# Patient Record
Sex: Male | Born: 2000 | Race: Black or African American | Hispanic: No | Marital: Single
Health system: Southern US, Community
[De-identification: ages and names within clinical notes are randomized; demographics above are authoritative.]

## PROBLEM LIST (undated history)

## (undated) DIAGNOSIS — J40 Bronchitis, not specified as acute or chronic: Secondary | ICD-10-CM

## (undated) DIAGNOSIS — F909 Attention-deficit hyperactivity disorder, unspecified type: Secondary | ICD-10-CM

## (undated) DIAGNOSIS — A549 Gonococcal infection, unspecified: Secondary | ICD-10-CM

## (undated) HISTORY — PX: OTHER SURGICAL HISTORY: SHX169

---
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
## 2018-01-19 ENCOUNTER — Encounter (HOSPITAL_COMMUNITY): Payer: Self-pay

## 2018-01-19 ENCOUNTER — Emergency Department (HOSPITAL_COMMUNITY)
Admission: EM | Admit: 2018-01-19 | Discharge: 2018-01-19 | Disposition: A | Discharge status: Home / Self Care | Payer: Medicaid Other | Attending: Emergency Medicine | Admitting: Emergency Medicine

## 2018-01-19 ENCOUNTER — Other Ambulatory Visit: Payer: Self-pay

## 2018-01-19 DIAGNOSIS — K029 Dental caries, unspecified: Secondary | ICD-10-CM | POA: Insufficient documentation

## 2018-01-19 DIAGNOSIS — K0889 Other specified disorders of teeth and supporting structures: Secondary | ICD-10-CM

## 2018-01-19 DIAGNOSIS — F1290 Cannabis use, unspecified, uncomplicated: Secondary | ICD-10-CM | POA: Insufficient documentation

## 2018-01-19 MED ORDER — IBUPROFEN 400 MG PO TABS
600.0000 mg | ORAL_TABLET | Freq: Once | ORAL | Status: AC
  Administered 2018-01-19: 600 mg via ORAL
  Filled 2018-01-19: qty 1

## 2018-01-19 MED ORDER — CLINDAMYCIN HCL 150 MG PO CAPS
300.0000 mg | ORAL_CAPSULE | Freq: Once | ORAL | Status: AC
  Administered 2018-01-19: 300 mg via ORAL
  Filled 2018-01-19: qty 2

## 2018-01-19 MED ORDER — CLINDAMYCIN HCL 300 MG PO CAPS: 300.0000 mg | ORAL_CAPSULE | Freq: Three times a day (TID) | ORAL | 0 refills | Status: DC

## 2018-01-19 MED ORDER — IBUPROFEN 600 MG PO TABS: 600.0000 mg | ORAL_TABLET | Freq: Three times a day (TID) | ORAL | 0 refills | Status: AC

## 2018-01-19 NOTE — ED Triage Notes (Signed)
Pt having dental pain for one week on the left side.

## 2018-01-19 NOTE — ED Provider Notes (Signed)
[facility]ERGENC[facility]PART[facility] Provider Note   CSN: [id number] Arrival date & time: 01/19/18  [id number]     History   Chief Complaint Chief Complaint  Patient presents with  . Dental Pain    left    HPI Rodney Gomez is a 17 y.o. male presents with dental pain.  No significant past medical history.  The patient reports left upper and left lower dental pain for the past several days.  He is taking ibuprofen without significant relief.  He has been able to eat for 3 days because it hurt so much and feels like his heart is racing.  Nothing makes it better.  He denies fever or inability to swallow.  He does not have a dentist and has not seen one in years.  HPI  Past Medical History:  Diagnosis Date  . ADHD (attention deficit hyperactivity disorder)   . Bronchitis   . Gonorrhea     Patient Active Problem List   Diagnosis Date Noted  . ADHD (attention deficit hyperactivity disorder), combined type 04/19/2014  . Problems with learning 04/19/2014  . Psychosocial stressors 04/19/2014  . Passive smoke exposure 05/11/2013  . History of ADHD 05/11/2013  . Seasonal allergies 05/11/2013  . Acne 05/11/2013    Past Surgical History:  Procedure Laterality Date  . tube in ear          Home Medications    Prior to Admission medications   Not on File    Family History Family History  Problem Relation Age of Onset  . Seizures Father   . Diabetes Sister   . Seizures Paternal Grandmother     Social History Social History   Tobacco Use  . Smoking status: Passive Smoke Exposure - Never Smoker  . Smokeless tobacco: Never Used  Substance Use Topics  . Alcohol use: No  . Drug use: Yes    Types: Marijuana    Comment: daily     Allergies   Penicillins   Review of Systems Review of Systems  Constitutional: Negative for fever.  HENT: Positive for dental problem. Negative for trouble swallowing.      Physical Exam Updated Vital Signs BP 140/65  (BP Location: Right Arm)   Pulse 81   Temp 98.7 F (37.1 C) (Oral)   Resp 16   Ht 5\' 4"  (1.626 m)   Wt 61.7 kg   SpO2 100%   BMI 23.34 kg/m   Physical Exam  Constitutional: He is oriented to person, place, and time. He appears well-developed and well-nourished. No distress.  Cooperative.  Appears uncomfortable  HENT:  Head: Normocephalic and atraumatic.  Mouth/Throat: Uvula is midline, oropharynx is clear and moist and mucous membranes are normal. No trismus in the jaw. Abnormal dentition. Dental caries present. No dental abscesses.  Left lower posterior molar has a hole in it. There is no obvious infection. Wisdom tooth is impacted.  Left upper posterior molar appears to be decayed. Wisdom tooth is impacted here as well  Eyes: Pupils are equal, round, and reactive to light. Conjunctivae are normal. Right eye exhibits no discharge. Left eye exhibits no discharge. No scleral icterus.  Neck: Normal range of motion.  Cardiovascular: Normal rate and regular rhythm.  Pulmonary/Chest: Effort normal and breath sounds normal. No respiratory distress.  Abdominal: He exhibits no distension.  Neurological: He is alert and oriented to person, place, and time.  Skin: Skin is warm and dry.  Psychiatric: He has a normal mood and affect. His behavior  is normal.  Nursing note and vitals reviewed.    ED Treatments / Results  Labs (all labs ordered are listed, but only abnormal results are displayed) Labs Reviewed - No data to display  EKG None  Radiology No results found.  Procedures .Nerve Block Date/Time: 01/19/2018 11:19 AM Performed by: Bethel Born, PA-C Authorized by: Bethel Born, PA-C   Consent:    Consent obtained:  Verbal   Consent given by:  Patient   Risks discussed:  Infection, nerve damage and unsuccessful block   Alternatives discussed:  No treatment Indications:    Indications:  Pain relief Location:    Body area:  Head   Head nerve blocked:  infraalveolar.   Laterality:  Left Skin anesthesia (see MAR for exact dosages):    Skin anesthesia method:  None Procedure details (see MAR for exact dosages):    Block needle gauge:  27 G   Anesthetic injected:  Bupivacaine 0.5% WITH epi   Steroid injected:  None   Injection procedure:  Anatomic landmarks identified, incremental injection, anatomic landmarks palpated, introduced needle and negative aspiration for blood   Paresthesia:  None Post-procedure details:    Dressing:  None   Outcome:  Pain improved   Patient tolerance of procedure:  Tolerated well, no immediate complications   (including critical care time)    Medications Ordered in ED Medications - No data to display   Initial Impression / Assessment and Plan / ED Course  I have reviewed the triage vital signs and the nursing notes.  Pertinent labs & imaging results that were available during my care of the patient were reviewed by me and considered in my medical decision making (see chart for details).  Dental pain associated with dental caries and possible dental infection. Patient is afebrile, non toxic appearing, and swallowing secretions well. No concerning findings on exam. No obvious abscess and doubt deep space head or neck infection.  I gave patient referral to dentist and stressed the importance of dental follow up for ultimate management of dental pain. He was given Clindamycin and Ibuprofen and pain is improved. He is agreeable to dental block and he tolerated well. Advised f/u with Dr. Lucky Cowboy.   Final Clinical Impressions(s) / ED Diagnoses   Final diagnoses:  Pain, dental    ED Discharge Orders    None       Bethel Born, PA-C 01/19/18 1121    Terrilee Files, MD 01/20/18 [phone]

## 2018-01-19 NOTE — Discharge Instructions (Signed)
Take Ibuprofen 600mg  three times a day for pain and inflammation Take Clindamycin three times daily for possible infection Follow up with Dr. Lucky CowboyKnox (dentist)

## 2019-04-27 ENCOUNTER — Encounter (HOSPITAL_COMMUNITY): Payer: Self-pay | Admitting: Emergency Medicine

## 2019-04-27 ENCOUNTER — Ambulatory Visit (HOSPITAL_COMMUNITY)
Admission: EM | Admit: 2019-04-27 | Discharge: 2019-04-27 | Disposition: A | Discharge status: Home / Self Care | Payer: Medicaid Other | Attending: Family Medicine | Admitting: Family Medicine

## 2019-04-27 ENCOUNTER — Other Ambulatory Visit: Payer: Self-pay

## 2019-04-27 DIAGNOSIS — Z20822 Contact with and (suspected) exposure to covid-19: Secondary | ICD-10-CM | POA: Diagnosis not present

## 2019-04-27 DIAGNOSIS — F909 Attention-deficit hyperactivity disorder, unspecified type: Secondary | ICD-10-CM | POA: Diagnosis not present

## 2019-04-27 DIAGNOSIS — R112 Nausea with vomiting, unspecified: Secondary | ICD-10-CM | POA: Insufficient documentation

## 2019-04-27 NOTE — ED Triage Notes (Signed)
Workplace requested a covid test on patient after having nausea and low grade temp at work on Saturday.  Patient says he feels fine, feel like my normal self

## 2019-04-27 NOTE — Discharge Instructions (Addendum)
Your COVID test is pending.  You should self quarantine until your test result is back and is negative.    Take Tylenol as needed for fever or discomfort.  Rest and keep yourself hydrated.    Go to the emergency department if you develop high fever, shortness of breath, severe diarrhea, or other concerning symptoms.    

## 2019-04-27 NOTE — ED Provider Notes (Signed)
[facility]    CSN: [id number] Arrival date & time: 04/27/19  1153      History   Chief Complaint Chief Complaint  Patient presents with  . Letter for School/Work    HPI Rodney Gomez is a 18 y.o. male.   Patient reports that he vomited once x3 days ago.  Reports that his employer is requiring a work note stating that he can come back, with a negative Covid test.  Requesting Covid testing today.  Denies headache, fever, shortness of breath, sore throat, nausea, vomiting, diarrhea, rash, other symptoms.  ROS per HPI  The history is provided by the patient.    Past Medical History:  Diagnosis Date  . ADHD (attention deficit hyperactivity disorder)   . Bronchitis   . Gonorrhea     Patient Active Problem List   Diagnosis Date Noted  . ADHD (attention deficit hyperactivity disorder), combined type 04/19/2014  . Problems with learning 04/19/2014  . Psychosocial stressors 04/19/2014  . Passive smoke exposure 05/11/2013  . History of ADHD 05/11/2013  . Seasonal allergies 05/11/2013  . Acne 05/11/2013    Past Surgical History:  Procedure Laterality Date  . tube in ear         Home Medications    Prior to Admission medications   Medication Sig Start Date End Date Taking? Authorizing Provider  clindamycin (CLEOCIN) 300 MG capsule Take 1 capsule (300 mg total) by mouth 3 (three) times daily. 01/19/18   Bethel Born, PA-C  ibuprofen (ADVIL,MOTRIN) 600 MG tablet Take 1 tablet (600 mg total) by mouth 3 (three) times daily. 01/19/18   Bethel Born, PA-C    Family History Family History  Problem Relation Age of Onset  . Seizures Father   . Diabetes Sister   . Seizures Paternal Grandmother     Social History Social History   Tobacco Use  . Smoking status: Passive Smoke Exposure - Never Smoker  . Smokeless tobacco: Never Used  Substance Use Topics  . Alcohol use: No  . Drug use: Yes    Types: Marijuana    Comment: daily      Allergies   Penicillins   Review of Systems Review of Systems   Physical Exam Triage Vital Signs ED Triage Vitals  Enc Vitals Group     BP --      Pulse Rate 04/27/19 1237 (!) 51     Resp 04/27/19 1237 18     Temp 04/27/19 1237 98.1 F (36.7 C)     Temp Source 04/27/19 1237 Oral     SpO2 04/27/19 1237 100 %     Weight --      Height --      Head Circumference --      Peak Flow --      Pain Score 04/27/19 1235 0     Pain Loc --      Pain Edu? --      Excl. in [place]? --    No data found.  Updated Vital Signs Pulse (!) 51   Temp 98.1 F (36.7 C) (Oral)   Resp 18   SpO2 100%   Visual Acuity Right Eye Distance:   Left Eye Distance:   Bilateral Distance:    Right Eye Near:   Left Eye Near:    Bilateral Near:     Physical Exam Vitals and nursing note reviewed.  Constitutional:      General: He is not in acute distress.  Appearance: Normal appearance. He is well-developed and normal weight.  HENT:     Head: Normocephalic and atraumatic.     Right Ear: Tympanic membrane normal.     Left Ear: Tympanic membrane normal.     Nose: Nose normal. No congestion or rhinorrhea.     Mouth/Throat:     Mouth: Mucous membranes are moist.  Eyes:     Conjunctiva/sclera: Conjunctivae normal.  Cardiovascular:     Rate and Rhythm: Normal rate and regular rhythm.     Heart sounds: Normal heart sounds. No murmur.  Pulmonary:     Effort: Pulmonary effort is normal. No respiratory distress.     Breath sounds: Normal breath sounds.  Abdominal:     General: Bowel sounds are normal. There is no distension.     Palpations: Abdomen is soft. There is no mass.     Tenderness: There is no abdominal tenderness. There is no guarding or rebound.     Hernia: No hernia is present.  Musculoskeletal:        General: Normal range of motion.     Cervical back: Neck supple.  Skin:    General: Skin is warm and dry.     Capillary Refill: Capillary refill takes less than 2 seconds.   Neurological:     General: No focal deficit present.     Mental Status: He is alert and oriented to person, place, and time.  Psychiatric:        Mood and Affect: Mood normal.        Behavior: Behavior normal.      UC Treatments / Results  Labs (all labs ordered are listed, but only abnormal results are displayed) Labs Reviewed  SARS CORONAVIRUS 2 (TAT 6-24 HRS)    EKG   Radiology No results found.  Procedures Procedures (including critical care time)  Medications Ordered in UC Medications - No data to display  Initial Impression / Assessment and Plan / UC Course  I have reviewed the triage vital signs and the nursing notes.  Pertinent labs & imaging results that were available during my care of the patient were reviewed by me and considered in my medical decision making (see chart for details).     Vomiting episode x1, has since resolved. Requesting Covid testing today, swab obtained.  Instructed patient to quarantine until results are back and negative.  Instructed to go to the ER with shortness of breath, high fever, severe diarrhea, other symptoms.  Note for work provided today. Final Clinical Impressions(s) / UC Diagnoses   Final diagnoses:  Nausea and vomiting, intractability of vomiting not specified, unspecified vomiting type     Discharge Instructions     Your COVID test is pending.  You should self quarantine until your test result is back and is negative.    Take Tylenol as needed for fever or discomfort.  Rest and keep yourself hydrated.    Go to the emergency department if you develop high fever, shortness of breath, severe diarrhea, or other concerning symptoms.       ED Prescriptions    None     I have reviewed the PDMP during this encounter.   Faustino Congress, NP 04/27/19 1250

## 2019-04-28 LAB — SARS CORONAVIRUS 2 (TAT 6-24 HRS): SARS Coronavirus 2: NEGATIVE

## 2019-08-13 DIAGNOSIS — Z419 Encounter for procedure for purposes other than remedying health state, unspecified: Secondary | ICD-10-CM | POA: Diagnosis not present

## 2019-09-13 DIAGNOSIS — Z419 Encounter for procedure for purposes other than remedying health state, unspecified: Secondary | ICD-10-CM | POA: Diagnosis not present

## 2019-09-19 ENCOUNTER — Emergency Department: Payer: Medicaid Other

## 2019-09-19 ENCOUNTER — Encounter: Payer: Self-pay | Admitting: Radiology

## 2019-09-19 ENCOUNTER — Other Ambulatory Visit: Payer: Self-pay

## 2019-09-19 DIAGNOSIS — R2689 Other abnormalities of gait and mobility: Secondary | ICD-10-CM | POA: Diagnosis not present

## 2019-09-19 DIAGNOSIS — S79912A Unspecified injury of left hip, initial encounter: Secondary | ICD-10-CM | POA: Diagnosis not present

## 2019-09-19 DIAGNOSIS — S99912A Unspecified injury of left ankle, initial encounter: Secondary | ICD-10-CM | POA: Diagnosis not present

## 2019-09-19 DIAGNOSIS — Z7722 Contact with and (suspected) exposure to environmental tobacco smoke (acute) (chronic): Secondary | ICD-10-CM | POA: Insufficient documentation

## 2019-09-19 DIAGNOSIS — M545 Low back pain: Secondary | ICD-10-CM | POA: Diagnosis not present

## 2019-09-19 DIAGNOSIS — Y9302 Activity, running: Secondary | ICD-10-CM | POA: Insufficient documentation

## 2019-09-19 DIAGNOSIS — F909 Attention-deficit hyperactivity disorder, unspecified type: Secondary | ICD-10-CM | POA: Diagnosis not present

## 2019-09-19 DIAGNOSIS — Y999 Unspecified external cause status: Secondary | ICD-10-CM | POA: Diagnosis not present

## 2019-09-19 DIAGNOSIS — M26629 Arthralgia of temporomandibular joint, unspecified side: Secondary | ICD-10-CM | POA: Insufficient documentation

## 2019-09-19 DIAGNOSIS — F341 Dysthymic disorder: Secondary | ICD-10-CM | POA: Insufficient documentation

## 2019-09-19 DIAGNOSIS — Y9289 Other specified places as the place of occurrence of the external cause: Secondary | ICD-10-CM | POA: Insufficient documentation

## 2019-09-19 DIAGNOSIS — S93401A Sprain of unspecified ligament of right ankle, initial encounter: Secondary | ICD-10-CM | POA: Insufficient documentation

## 2019-09-19 DIAGNOSIS — R45851 Suicidal ideations: Secondary | ICD-10-CM | POA: Insufficient documentation

## 2019-09-19 DIAGNOSIS — S3992XA Unspecified injury of lower back, initial encounter: Secondary | ICD-10-CM | POA: Diagnosis not present

## 2019-09-19 DIAGNOSIS — W1789XA Other fall from one level to another, initial encounter: Secondary | ICD-10-CM | POA: Diagnosis not present

## 2019-09-19 DIAGNOSIS — S99911A Unspecified injury of right ankle, initial encounter: Secondary | ICD-10-CM | POA: Diagnosis present

## 2019-09-19 LAB — CBC
HCT: 40.9 % (ref 39.0–52.0)
Hemoglobin: 13.2 g/dL (ref 13.0–17.0)
MCH: 31.4 pg (ref 26.0–34.0)
MCHC: 32.3 g/dL (ref 30.0–36.0)
MCV: 97.1 fL (ref 80.0–100.0)
Platelets: 186 10*3/uL (ref 150–400)
RBC: 4.21 MIL/uL — ABNORMAL LOW (ref 4.22–5.81)
RDW: 11.9 % (ref 11.5–15.5)
WBC: 9.8 10*3/uL (ref 4.0–10.5)
nRBC: 0 % (ref 0.0–0.2)

## 2019-09-19 LAB — ETHANOL: Alcohol, Ethyl (B): <10 mg/dL (ref <10)

## 2019-09-19 LAB — COMPREHENSIVE METABOLIC PANEL
ALT: 18 U/L (ref 0–44)
AST: 27 U/L (ref 15–41)
Albumin: 4.7 g/dL (ref 3.5–5.0)
Alkaline Phosphatase: 79 U/L (ref 38–126)
Anion gap: 11 (ref 5–15)
BUN: 18 mg/dL (ref 6–20)
CO2: 23 mmol/L (ref 22–32)
Calcium: 9.7 mg/dL (ref 8.9–10.3)
Chloride: 105 mmol/L (ref 98–111)
Creatinine, Ser: 0.99 mg/dL (ref 0.61–1.24)
GFR calc Af Amer: >60 mL/min (ref >60)
GFR calc non Af Amer: >60 mL/min (ref >60)
Glucose, Bld: 96 mg/dL (ref 70–99)
Potassium: 3.4 mmol/L — ABNORMAL LOW (ref 3.5–5.1)
Sodium: 139 mmol/L (ref 135–145)
Total Bilirubin: 1 mg/dL (ref 0.3–1.2)
Total Protein: 8.3 g/dL — ABNORMAL HIGH (ref 6.5–8.1)

## 2019-09-19 LAB — SALICYLATE LEVEL: Salicylate Lvl: <7 mg/dL — ABNORMAL LOW (ref 7.0–30.0)

## 2019-09-19 LAB — ACETAMINOPHEN LEVEL: Acetaminophen (Tylenol), Serum: <10 ug/mL — ABNORMAL LOW (ref 10–30)

## 2019-09-19 NOTE — ED Notes (Signed)
Spoke with dr. Darnelle Catalan regarding pt's injury and feet of fall. md placing orders at this time.

## 2019-09-19 NOTE — ED Triage Notes (Addendum)
Pt in custody of [employer] with officer Lueth. Pt presents after falling off "a two story building" per pt injuring left ankle. Pt states he is also contemplating suicide. Pt denies other injuries, denies loc. Officer with pt states he fell approx 25 feet.

## 2019-09-20 ENCOUNTER — Emergency Department
Admission: EM | Admit: 2019-09-20 | Discharge: 2019-09-20 | Disposition: A | Discharge status: Home / Self Care | Payer: Medicaid Other | Attending: Emergency Medicine | Admitting: Emergency Medicine

## 2019-09-20 DIAGNOSIS — S93401A Sprain of unspecified ligament of right ankle, initial encounter: Secondary | ICD-10-CM

## 2019-09-20 DIAGNOSIS — W19XXXA Unspecified fall, initial encounter: Secondary | ICD-10-CM

## 2019-09-20 MED ORDER — IBUPROFEN 600 MG PO TABS
600.0000 mg | ORAL_TABLET | Freq: Three times a day (TID) | ORAL | Status: DC | PRN
  Administered 2019-09-20: 600 mg via ORAL
  Filled 2019-09-20: qty 1

## 2019-09-20 MED ORDER — ALUM & MAG HYDROXIDE-SIMETH 200-200-20 MG/5ML PO SUSP: 30.0000 mL | Freq: Four times a day (QID) | ORAL | Status: DC | PRN

## 2019-09-20 MED ORDER — ONDANSETRON HCL 4 MG PO TABS: 4.0000 mg | ORAL_TABLET | Freq: Three times a day (TID) | ORAL | Status: DC | PRN

## 2019-09-20 NOTE — ED Provider Notes (Signed)
[facility] Emergency Department Provider Note  ____________________________________________   None    (approximate)  I have reviewed the triage vital signs and the nursing notes.   HISTORY  Chief Complaint Ankle Injury and Psychiatric Evaluation    HPI Rodney Gomez is a 19 y.o. male  With PMHx below here with jump from 15-20 feet, as well as SI. Pt initially brought in because he was running from police and jumped 15-20 feet down from a balcony. He rolled immediately and continued running. However, he has since c/o R ankle pain, mild low back pain. No loss of bowel or bladder continence. No numbness or weakness. No other injuries. He also told PD that he has been suffering from depression and that he wanted to harm himself when he jumped. He tells me he has suffered with depression "for a long time" and has had worsening dysphoria, feelings of worthlessness. He states he often feels like he wants to harm himself and has considered cutting his own wrists, overdosing on medication/alcohol/marijuana. Requesting help/someone to talk to. Arrives with PD under arrest.    Past Medical History:  Diagnosis Date  . ADHD (attention deficit hyperactivity disorder)   . Bronchitis   . Gonorrhea     Patient Active Problem List   Diagnosis Date Noted  . ADHD (attention deficit hyperactivity disorder), combined type 04/19/2014  . Problems with learning 04/19/2014  . Psychosocial stressors 04/19/2014  . Passive smoke exposure 05/11/2013  . History of ADHD 05/11/2013  . Seasonal allergies 05/11/2013  . Acne 05/11/2013    Past Surgical History:  Procedure Laterality Date  . tube in ear      Prior to Admission medications   Medication Sig Start Date End Date Taking? Authorizing Provider  clindamycin (CLEOCIN) 300 MG capsule Take 1 capsule (300 mg total) by mouth 3 (three) times daily. 01/19/18   Bethel Born, PA-C  ibuprofen (ADVIL,MOTRIN) 600 MG tablet  Take 1 tablet (600 mg total) by mouth 3 (three) times daily. 01/19/18   Bethel Born, PA-C    Allergies Penicillins  Family History  Problem Relation Age of Onset  . Seizures Father   . Diabetes Sister   . Seizures Paternal Grandmother     Social History Social History   Tobacco Use  . Smoking status: Passive Smoke Exposure - Never Smoker  . Smokeless tobacco: Never Used  Substance Use Topics  . Alcohol use: No  . Drug use: Yes    Types: Marijuana    Comment: daily    Review of Systems  Review of Systems  Constitutional: Negative for chills and fever.  HENT: Negative for sore throat.   Respiratory: Negative for shortness of breath.   Cardiovascular: Negative for chest pain.  Gastrointestinal: Negative for abdominal pain.  Genitourinary: Negative for flank pain.  Musculoskeletal: Positive for arthralgias, gait problem and joint swelling. Negative for neck pain.  Skin: Negative for rash and wound.  Allergic/Immunologic: Negative for immunocompromised state.  Neurological: Negative for weakness and numbness.  Hematological: Does not bruise/bleed easily.  Psychiatric/Behavioral: Positive for dysphoric mood and suicidal ideas.     ____________________________________________  PHYSICAL EXAM:      VITAL SIGNS: ED Triage Vitals [09/19/19 2003]  Enc Vitals Group     BP 107/61     Pulse Rate 81     Resp 16     Temp 98.3 F (36.8 C)     Temp Source Oral     SpO2 100 %  Weight 130 lb (59 kg)     Height 5\' 4"  (1.626 m)     Head Circumference      Peak Flow      Pain Score 9     Pain Loc      Pain Edu?      Excl. in GC?      Physical Exam Vitals and nursing note reviewed.  Constitutional:      General: He is not in acute distress.    Appearance: He is well-developed.  HENT:     Head: Normocephalic and atraumatic.  Eyes:     Conjunctiva/sclera: Conjunctivae normal.  Cardiovascular:     Rate and Rhythm: Normal rate and regular rhythm.     Heart  sounds: Normal heart sounds.  Pulmonary:     Effort: Pulmonary effort is normal. No respiratory distress.     Breath sounds: No wheezing.  Abdominal:     General: There is no distension.  Musculoskeletal:     Cervical back: Neck supple.     Comments: Moderate TTP R anteroalteral ankle, no deformity. No calcaneal TTP or bruising b/l. Mild TTP midline lower back without deformity.   Skin:    General: Skin is warm.     Capillary Refill: Capillary refill takes less than 2 seconds.     Findings: No rash.  Neurological:     Mental Status: He is alert and oriented to person, place, and time.     Motor: No abnormal muscle tone.     Comments: Strength 5/5 bilateral UE and LE. Normal sensation to light touch.  Psychiatric:     Comments: Endorses dysphoric mood, +SI       ____________________________________________   LABS (all labs ordered are listed, but only abnormal results are displayed)  Labs Reviewed  COMPREHENSIVE METABOLIC PANEL - Abnormal; Notable for the following components:      Result Value   Potassium 3.4 (*)    Total Protein 8.3 (*)    All other components within normal limits  SALICYLATE LEVEL - Abnormal; Notable for the following components:   Salicylate Lvl <7.0 (*)    All other components within normal limits  ACETAMINOPHEN LEVEL - Abnormal; Notable for the following components:   Acetaminophen (Tylenol), Serum <10 (*)    All other components within normal limits  CBC - Abnormal; Notable for the following components:   RBC 4.21 (*)    All other components within normal limits  SARS CORONAVIRUS 2 BY RT PCR (HOSPITAL ORDER, PERFORMED IN [facility])  ETHANOL  URINE DRUG SCREEN, QUALITATIVE ([facility] ONLY)    ____________________________________________  EKG: None ________________________________________  RADIOLOGY All imaging, including plain films, CT scans, and ultrasounds, independently reviewed by me, and interpretations confirmed via formal  radiology reads.  ED MD interpretation:   Lumbar spine: Negative XR Ankle: negative   Official radiology report(s): DG Lumbar Spine Complete  Result Date: 09/19/2019 CLINICAL DATA:  Fall 25 ft EXAM: LUMBAR SPINE - COMPLETE 4+ VIEW COMPARISON:  None. FINDINGS: There is no evidence of lumbar spine fracture. Alignment is normal. Intervertebral disc spaces are maintained. IMPRESSION: Negative. Electronically Signed   By: 01-23-1975 M.D.   On: 09/19/2019 20:59   DG Ankle Complete Left  Result Date: 09/19/2019 CLINICAL DATA:  Fall, pain, attention to calcaneus EXAM: LEFT ANKLE COMPLETE - 3+ VIEW; LEFT FOOT - COMPLETE 3+ VIEW COMPARISON:  None. FINDINGS: There is no evidence of fracture, dislocation, or joint effusion. There is no evidence of  arthropathy or other focal bone abnormality. Soft tissues are unremarkable. IMPRESSION: No fracture or dislocation of the left foot or ankle with specific attention to the calcaneus. Electronically Signed   By: Lauralyn Primes M.D.   On: 09/19/2019 20:55   DG Foot Complete Left  Result Date: 09/19/2019 CLINICAL DATA:  Fall, pain, attention to calcaneus EXAM: LEFT ANKLE COMPLETE - 3+ VIEW; LEFT FOOT - COMPLETE 3+ VIEW COMPARISON:  None. FINDINGS: There is no evidence of fracture, dislocation, or joint effusion. There is no evidence of arthropathy or other focal bone abnormality. Soft tissues are unremarkable. IMPRESSION: No fracture or dislocation of the left foot or ankle with specific attention to the calcaneus. Electronically Signed   By: Lauralyn Primes M.D.   On: 09/19/2019 20:55   DG Hip Unilat W or Wo Pelvis 2-3 Views Left  Result Date: 09/19/2019 CLINICAL DATA:  Fall 25 ft EXAM: DG HIP (WITH OR WITHOUT PELVIS) 2-3V LEFT COMPARISON:  None. FINDINGS: There is no evidence of hip fracture or dislocation. There is no evidence of arthropathy or other focal bone abnormality. IMPRESSION: Negative. Electronically Signed   By: Charlett Nose M.D.   On: 09/19/2019 20:58     ____________________________________________  PROCEDURES   Procedure(s) performed (including Critical Care):  Procedures  ____________________________________________  INITIAL IMPRESSION / MDM / ASSESSMENT AND PLAN / ED COURSE  As part of my medical decision making, I reviewed the following data within the electronic MEDICAL RECORD NUMBER Nursing notes reviewed and incorporated, Old chart reviewed, Notes from prior ED visits, and [place] Controlled Substance Database       *Mirko Brookens was evaluated in Emergency Department on 09/20/2019 for the symptoms described in the history of present illness. He was evaluated in the context of the global COVID-19 pandemic, which necessitated consideration that the patient might be at risk for infection with the SARS-CoV-2 virus that causes COVID-19. Institutional protocols and algorithms that pertain to the evaluation of patients at risk for COVID-19 are in a state of rapid change based on information released by regulatory bodies including the CDC and federal and state organizations. These policies and algorithms were followed during the patient's care in the ED.  Some ED evaluations and interventions may be delayed as a result of limited staffing during the pandemic.*     Medical Decision Making:  19 yo M here with ankle pain after jump from 15-20 feet, as well as SI. Re: his fall, no fx on imaging. Specifically, no calcaneal injury and he has no significant swelling, bruising on exam. He has mild back pain btu no signs of cord compression or more significant injury. No other trauma. Will give motrin, ACE wrap for possible ankle sprain. Otherwise, re: his SI. Will consult TTS. Labs reviewed, unremarkable. Medically clear.  ____________________________________________  FINAL CLINICAL IMPRESSION(S) / ED DIAGNOSES  Final diagnoses:  Sprain of right ankle, unspecified ligament, initial encounter  Suicidal ideation  Fall, initial encounter      MEDICATIONS GIVEN DURING THIS VISIT:  Medications  ibuprofen (ADVIL) tablet 600 mg (has no administration in time range)  alum & mag hydroxide-simeth (MAALOX/MYLANTA) 200-200-20 MG/5ML suspension 30 mL (has no administration in time range)  ondansetron (ZOFRAN) tablet 4 mg (has no administration in time range)     ED Discharge Orders    None       Note:  This document was prepared using Dragon voice recognition software and may include unintentional dictation errors.   Shaune Pollack, MD 09/20/19 [phone]

## 2019-09-20 NOTE — ED Notes (Signed)
Pt is up for discharge, ankle splint applied by bret ed tech. Pt tol well

## 2019-09-20 NOTE — ED Notes (Signed)
Pt unable to sign discharge due to handcuffs, pt did verbalize understanding

## 2019-09-20 NOTE — ED Provider Notes (Signed)
-----------------------------------------   8:17 AM on 09/20/2019 -----------------------------------------  I have personally seen and evaluated the patient.  Lab work is largely nonrevealing.  X-ray imaging is negative as well.  We will place in a removable air splint for the right ankle discomfort.  After speaking to police patient was actively running from police when he jumped from the window and continue to run from police after landing on the ground.  Patient does state depression however I highly suspect that the patient's jump from the window was due entirely to him actively running from police officers and not due to any suicidal intent.  Police officers are waiting to bring the patient to jail.  At this time I believe the patient is medically cleared and safe to be discharged from the emergency department into the care of the police officers and facilities for further management.   Minna Antis, MD 09/20/19 [phone]

## 2019-10-14 DIAGNOSIS — Z419 Encounter for procedure for purposes other than remedying health state, unspecified: Secondary | ICD-10-CM | POA: Diagnosis not present

## 2019-11-13 DIAGNOSIS — Z419 Encounter for procedure for purposes other than remedying health state, unspecified: Secondary | ICD-10-CM | POA: Diagnosis not present

## 2019-12-14 DIAGNOSIS — Z419 Encounter for procedure for purposes other than remedying health state, unspecified: Secondary | ICD-10-CM | POA: Diagnosis not present

## 2020-01-13 DIAGNOSIS — Z419 Encounter for procedure for purposes other than remedying health state, unspecified: Secondary | ICD-10-CM | POA: Diagnosis not present

## 2020-02-13 DIAGNOSIS — Z419 Encounter for procedure for purposes other than remedying health state, unspecified: Secondary | ICD-10-CM | POA: Diagnosis not present

## 2020-03-15 DIAGNOSIS — Z419 Encounter for procedure for purposes other than remedying health state, unspecified: Secondary | ICD-10-CM | POA: Diagnosis not present

## 2020-04-12 DIAGNOSIS — Z419 Encounter for procedure for purposes other than remedying health state, unspecified: Secondary | ICD-10-CM | POA: Diagnosis not present

## 2020-05-13 DIAGNOSIS — Z419 Encounter for procedure for purposes other than remedying health state, unspecified: Secondary | ICD-10-CM | POA: Diagnosis not present

## 2020-06-12 DIAGNOSIS — Z419 Encounter for procedure for purposes other than remedying health state, unspecified: Secondary | ICD-10-CM | POA: Diagnosis not present

## 2020-07-13 DIAGNOSIS — Z419 Encounter for procedure for purposes other than remedying health state, unspecified: Secondary | ICD-10-CM | POA: Diagnosis not present

## 2020-08-12 DIAGNOSIS — Z419 Encounter for procedure for purposes other than remedying health state, unspecified: Secondary | ICD-10-CM | POA: Diagnosis not present

## 2020-09-12 DIAGNOSIS — Z419 Encounter for procedure for purposes other than remedying health state, unspecified: Secondary | ICD-10-CM | POA: Diagnosis not present

## 2020-10-13 DIAGNOSIS — Z419 Encounter for procedure for purposes other than remedying health state, unspecified: Secondary | ICD-10-CM | POA: Diagnosis not present

## 2020-11-12 DIAGNOSIS — Z419 Encounter for procedure for purposes other than remedying health state, unspecified: Secondary | ICD-10-CM | POA: Diagnosis not present

## 2020-12-13 DIAGNOSIS — Z419 Encounter for procedure for purposes other than remedying health state, unspecified: Secondary | ICD-10-CM | POA: Diagnosis not present

## 2021-01-12 DIAGNOSIS — Z419 Encounter for procedure for purposes other than remedying health state, unspecified: Secondary | ICD-10-CM | POA: Diagnosis not present

## 2021-07-11 ENCOUNTER — Emergency Department
Admission: EM | Admit: 2021-07-11 | Discharge: 2021-07-11 | Disposition: A | Discharge status: Home / Self Care | Payer: Medicaid Other | Attending: Emergency Medicine | Admitting: Emergency Medicine

## 2021-07-11 DIAGNOSIS — J039 Acute tonsillitis, unspecified: Secondary | ICD-10-CM | POA: Diagnosis not present

## 2021-07-11 DIAGNOSIS — R52 Pain, unspecified: Secondary | ICD-10-CM | POA: Diagnosis not present

## 2021-07-11 DIAGNOSIS — Z20822 Contact with and (suspected) exposure to covid-19: Secondary | ICD-10-CM | POA: Insufficient documentation

## 2021-07-11 DIAGNOSIS — R531 Weakness: Secondary | ICD-10-CM | POA: Diagnosis not present

## 2021-07-11 DIAGNOSIS — G4489 Other headache syndrome: Secondary | ICD-10-CM | POA: Diagnosis not present

## 2021-07-11 DIAGNOSIS — J029 Acute pharyngitis, unspecified: Secondary | ICD-10-CM

## 2021-07-11 DIAGNOSIS — R209 Unspecified disturbances of skin sensation: Secondary | ICD-10-CM | POA: Diagnosis not present

## 2021-07-11 DIAGNOSIS — R9431 Abnormal electrocardiogram [ECG] [EKG]: Secondary | ICD-10-CM | POA: Diagnosis not present

## 2021-07-11 DIAGNOSIS — Z743 Need for continuous supervision: Secondary | ICD-10-CM | POA: Diagnosis not present

## 2021-07-11 LAB — RESP PANEL BY RT-PCR (FLU A&B, COVID) ARPGX2
Influenza A by PCR: NEGATIVE
Influenza B by PCR: NEGATIVE
SARS Coronavirus 2 by RT PCR: NEGATIVE

## 2021-07-11 LAB — GROUP A STREP BY PCR: Group A Strep by PCR: NOT DETECTED

## 2021-07-11 LAB — URINALYSIS, ROUTINE W REFLEX MICROSCOPIC
Bacteria, UA: NONE SEEN
Bilirubin Urine: NEGATIVE
Glucose, UA: NEGATIVE mg/dL
Hgb urine dipstick: NEGATIVE
Ketones, ur: NEGATIVE mg/dL
Nitrite: NEGATIVE
Protein, ur: 30 mg/dL — AB
Specific Gravity, Urine: 1.032 — ABNORMAL HIGH (ref 1.005–1.030)
pH: 6 (ref 5.0–8.0)

## 2021-07-11 LAB — CBC
HCT: 41.9 % (ref 39.0–52.0)
Hemoglobin: 13.6 g/dL (ref 13.0–17.0)
MCH: 30.7 pg (ref 26.0–34.0)
MCHC: 32.5 g/dL (ref 30.0–36.0)
MCV: 94.6 fL (ref 80.0–100.0)
Platelets: 170 10*3/uL (ref 150–400)
RBC: 4.43 MIL/uL (ref 4.22–5.81)
RDW: 11.7 % (ref 11.5–15.5)
WBC: 13.9 10*3/uL — ABNORMAL HIGH (ref 4.0–10.5)
nRBC: 0 % (ref 0.0–0.2)

## 2021-07-11 LAB — COMPREHENSIVE METABOLIC PANEL
ALT: 16 U/L (ref 0–44)
AST: 21 U/L (ref 15–41)
Albumin: 4.4 g/dL (ref 3.5–5.0)
Alkaline Phosphatase: 75 U/L (ref 38–126)
Anion gap: 11 (ref 5–15)
BUN: 13 mg/dL (ref 6–20)
CO2: 22 mmol/L (ref 22–32)
Calcium: 9.8 mg/dL (ref 8.9–10.3)
Chloride: 100 mmol/L (ref 98–111)
Creatinine, Ser: 0.89 mg/dL (ref 0.61–1.24)
GFR, Estimated: >60 mL/min (ref >60)
Glucose, Bld: 105 mg/dL — ABNORMAL HIGH (ref 70–99)
Potassium: 3.7 mmol/L (ref 3.5–5.1)
Sodium: 133 mmol/L — ABNORMAL LOW (ref 135–145)
Total Bilirubin: 1.2 mg/dL (ref 0.3–1.2)
Total Protein: 8 g/dL (ref 6.5–8.1)

## 2021-07-11 LAB — LIPASE, BLOOD: Lipase: 42 U/L (ref 11–51)

## 2021-07-11 MED ORDER — MORPHINE SULFATE (PF) 4 MG/ML IV SOLN
4.0000 mg | Freq: Once | INTRAVENOUS | Status: DC
  Filled 2021-07-11: qty 1

## 2021-07-11 MED ORDER — LACTATED RINGERS IV BOLUS: 1000.0000 mL | Freq: Once | INTRAVENOUS | Status: DC

## 2021-07-11 MED ORDER — CLINDAMYCIN HCL 150 MG PO CAPS: 450.0000 mg | ORAL_CAPSULE | Freq: Two times a day (BID) | ORAL | 0 refills | Status: AC

## 2021-07-11 MED ORDER — ONDANSETRON HCL 4 MG/2ML IJ SOLN
4.0000 mg | Freq: Once | INTRAMUSCULAR | Status: DC
  Filled 2021-07-11: qty 2

## 2021-07-11 NOTE — ED Triage Notes (Signed)
Pt to ED for sore throat since last night.   Pt states L leg gave out this morning and has lower back pain that shoots down both legs. States mouth feels parched and has HA, feels dehydrated.   Has not eaten or drank since last night. Also vomited twice today. Also endorses lower abdominal pain above SP. States lower abdominal pain also wraps around to back on both sides to lower back. Describes this pain as stabbing. Denies pain with urination. Denies diarrhea. LBM was yesterday.   Lower abdominal pain is 5/10, lower back pain is 10/10.

## 2021-07-11 NOTE — ED Notes (Signed)
Call bell within reach, stretcher locked low, rail up.

## 2021-07-11 NOTE — ED Provider Notes (Signed)
[facility] Provider Note   Event Date/Time   First MD Initiated Contact with Patient 07/11/21 1627     (approximate) History  Sore Throat and Abdominal Pain  HPI Rodney Gomez is a 21 y.o. male  Location: Throat, central abdomen Duration: Began last night Timing: Worsening since onset, abdominal pain and lower extremity pain have resolved Severity: 5/10 Quality: Burning, soreness Context: Patient states that he noticed sore throat and fever that began last night and has been worsening since onset.  Patient also states that when he awoke this morning he had 5/10 lower abdominal pain as well as significant weakness and bilateral lower extremities with 10/10 lower back pain. Modifying factors: Sore throat is worse with swallowing and denies any alleviating symptoms Associated Symptoms: Fever, painful swallowing ROS: Patient currently denies any vision changes, tinnitus, difficulty speaking, facial droop, sore throat, chest pain, shortness of breath, abdominal pain, nausea/vomiting/diarrhea, dysuria, or weakness/numbness/paresthesias in any extremity   Physical Exam  Triage Vital Signs: ED Triage Vitals  Enc Vitals Group     BP 07/11/21 1526 137/81     Pulse Rate 07/11/21 1526 97     Resp 07/11/21 1526 20     Temp 07/11/21 1526 100.2 F (37.9 C)     Temp Source 07/11/21 1526 Oral     SpO2 07/11/21 1526 93 %     Weight 07/11/21 1528 140 lb (63.5 kg)     Height 07/11/21 1528 5\' 8"  (1.727 m)     Head Circumference --      Peak Flow --      Pain Score 07/11/21 1527 5     Pain Loc --      Pain Edu? --      Excl. in [place]? --    Most recent vital signs: Vitals:   07/11/21 1642 07/11/21 1830  BP: (!) 146/73 (!) 131/54  Pulse:  92  Resp:  18  Temp:    SpO2:  93%   General: Awake, oriented x4. CV:  Good peripheral perfusion.  Resp:  Normal effort.  Abd:  No distention.  Other:  Young adult African-American male laying in bed in no acute distress.   Erythematous posterior oropharynx with significant swelling and erythema to the left tonsillar area ED Results / Procedures / Treatments  Labs (all labs ordered are listed, but only abnormal results are displayed) Labs Reviewed  COMPREHENSIVE METABOLIC PANEL - Abnormal; Notable for the following components:      Result Value   Sodium 133 (*)    Glucose, Bld 105 (*)    All other components within normal limits  CBC - Abnormal; Notable for the following components:   WBC 13.9 (*)    All other components within normal limits  URINALYSIS, ROUTINE W REFLEX MICROSCOPIC - Abnormal; Notable for the following components:   Color, Urine YELLOW (*)    APPearance HAZY (*)    Specific Gravity, Urine 1.032 (*)    Protein, ur 30 (*)    Leukocytes,Ua SMALL (*)    All other components within normal limits  GROUP A STREP BY PCR  RESP PANEL BY RT-PCR (FLU A&B, COVID) [id number]  LIPASE, BLOOD   PROCEDURES: Critical Care performed: No .1-3 Lead EKG Interpretation Performed by: Naaman Plummer, MD Authorized by: Naaman Plummer, MD     Interpretation: normal     ECG rate:  90   ECG rate assessment: normal     Rhythm: sinus rhythm  Ectopy: none     Conduction: normal   MEDICATIONS ORDERED IN ED: Medications  lactated ringers bolus 1,000 mL (1,000 mLs Intravenous Patient Refused/Not Given 07/11/21 1840)  ondansetron (ZOFRAN) injection 4 mg (4 mg Intravenous Patient Refused/Not Given 07/11/21 1840)  morphine (PF) 4 MG/ML injection 4 mg (4 mg Intravenous Patient Refused/Not Given 07/11/21 1840)   IMPRESSION / MDM / ASSESSMENT AND PLAN / ED COURSE  I reviewed the triage vital signs and the nursing notes.                             The patient is on the cardiac monitor to evaluate for evidence of arrhythmia and/or significant heart rate changes. Patient's presentation is most consistent with acute presentation with potential threat to life or bodily function. 21 year old male presents for sore  throat No history of immunocompromise. Nontoxic appearance. Patient euvolemic with no trismus. No airway compromise. No change in voice, exudates, enlarged lymph nodes. Able to tolerate PO. Given History and Exam I have low suspicion for this presentation being caused by RPA, Ludwigs angina, Epiglottitis or Bacterial Tracheitis, EBV, acute HIV, or Strep throat. Based on history and physical exam, concern for possible peritonsillar cellulitis versus abscess and will treat empirically with clindamycin as patient has had success with this antibiotic in the past. Dispo: Discharge home    FINAL CLINICAL IMPRESSION(S) / ED DIAGNOSES   Final diagnoses:  Pharyngitis, unspecified etiology  Tonsillitis   Rx / DC Orders   ED Discharge Orders          Ordered    clindamycin (CLEOCIN) 150 MG capsule  2 times daily        07/11/21 1851           Note:  This document was prepared using Dragon voice recognition software and may include unintentional dictation errors.   Naaman Plummer, MD 07/11/21 Drema Halon

## 2021-07-11 NOTE — ED Notes (Signed)
First nurse note-pt brought in via ems with sore throat, vomiting since yesterday.  Vs wnl per ems.  Fsbs 114 per ems  pt alert, sitting in wheelchair in lobby.

## 2021-07-11 NOTE — ED Notes (Signed)
See triage note. Pt confirms sore throat, N/V since yesterday (3 episodes total per pt), pt states tender at lower abdomen, denies diarrhea, reports fever since yesterday but pt unsure of highest temp. Pt laying calmly on stretcher, grimaces at times, smell of marijuana on pt's clothes, pt denies recently smoking marijuana, pt denies smoking cigarettes, pt's resp reg/unlabored and skin dry. Pt has hoodie on and blanket. Pt on personal phone with family/significant other.

## 2021-07-11 NOTE — ED Notes (Signed)
Pt refusing PIV and IV meds/fluids at this time. Dr. Vicente Males notified.

## 2021-07-12 ENCOUNTER — Telehealth: Payer: Self-pay

## 2021-07-12 NOTE — Telephone Encounter (Signed)
Transition Care Management Unsuccessful Follow-up Telephone Call  Date of discharge and from where:  07/12/2021-[facility]  Attempts:  1st Attempt  Reason for unsuccessful TCM follow-up call:  Unable to reach patient    

## 2021-07-13 NOTE — Telephone Encounter (Signed)
Transition Care Management Unsuccessful Follow-up Telephone Call  Date of discharge and from where:  07/12/2021-[facility]  Attempts:  2nd Attempt  Reason for unsuccessful TCM follow-up call:  Unable to reach patient

## 2021-07-14 NOTE — Telephone Encounter (Signed)
Transition Care Management Unsuccessful Follow-up Telephone Call  Date of discharge and from where:  07/12/2021-[facility]  Attempts:  3rd Attempt  Reason for unsuccessful TCM follow-up call:  Unable to reach patient

## 2021-11-21 IMAGING — CR DG HIP (WITH OR WITHOUT PELVIS) 2-3V*L*
3 series · 3 of 3 positions shown · non-contrast
Comparison: None.

CLINICAL DATA: Fall 25 ft

EXAM:
DG HIP (WITH OR WITHOUT PELVIS) 2-3V LEFT

[pelvis ap]
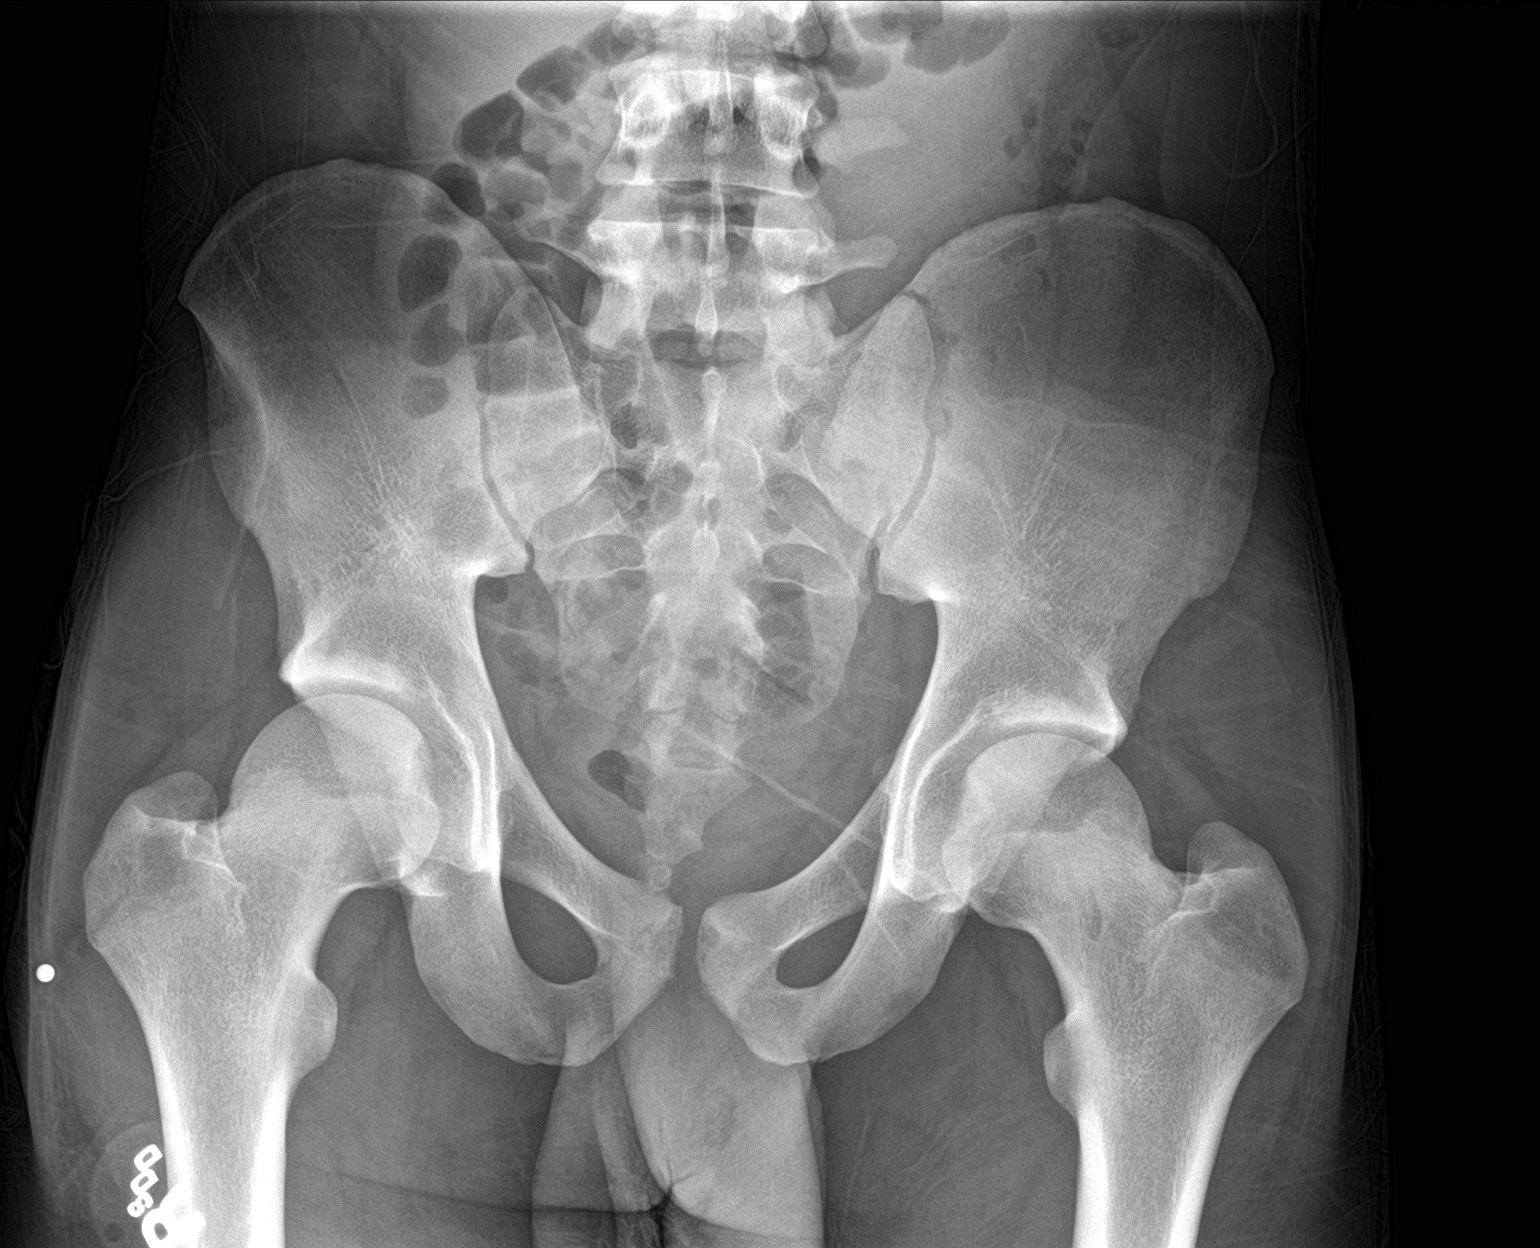

[hip ap]
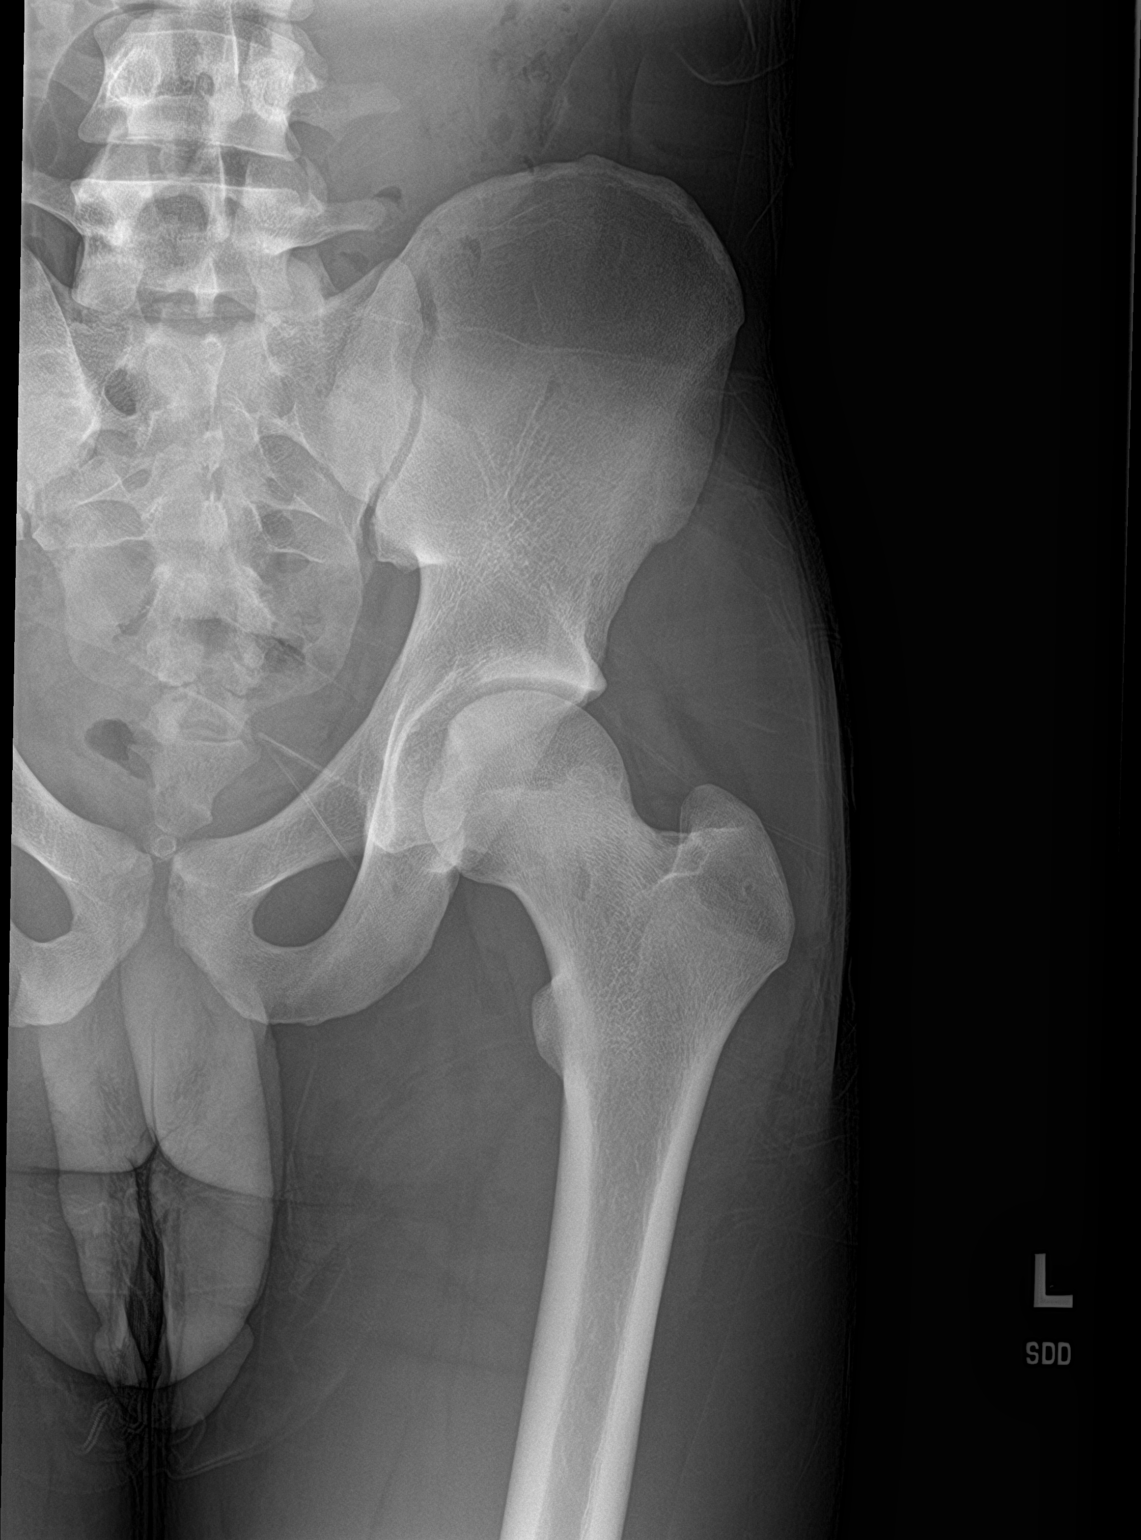

[hip lat]
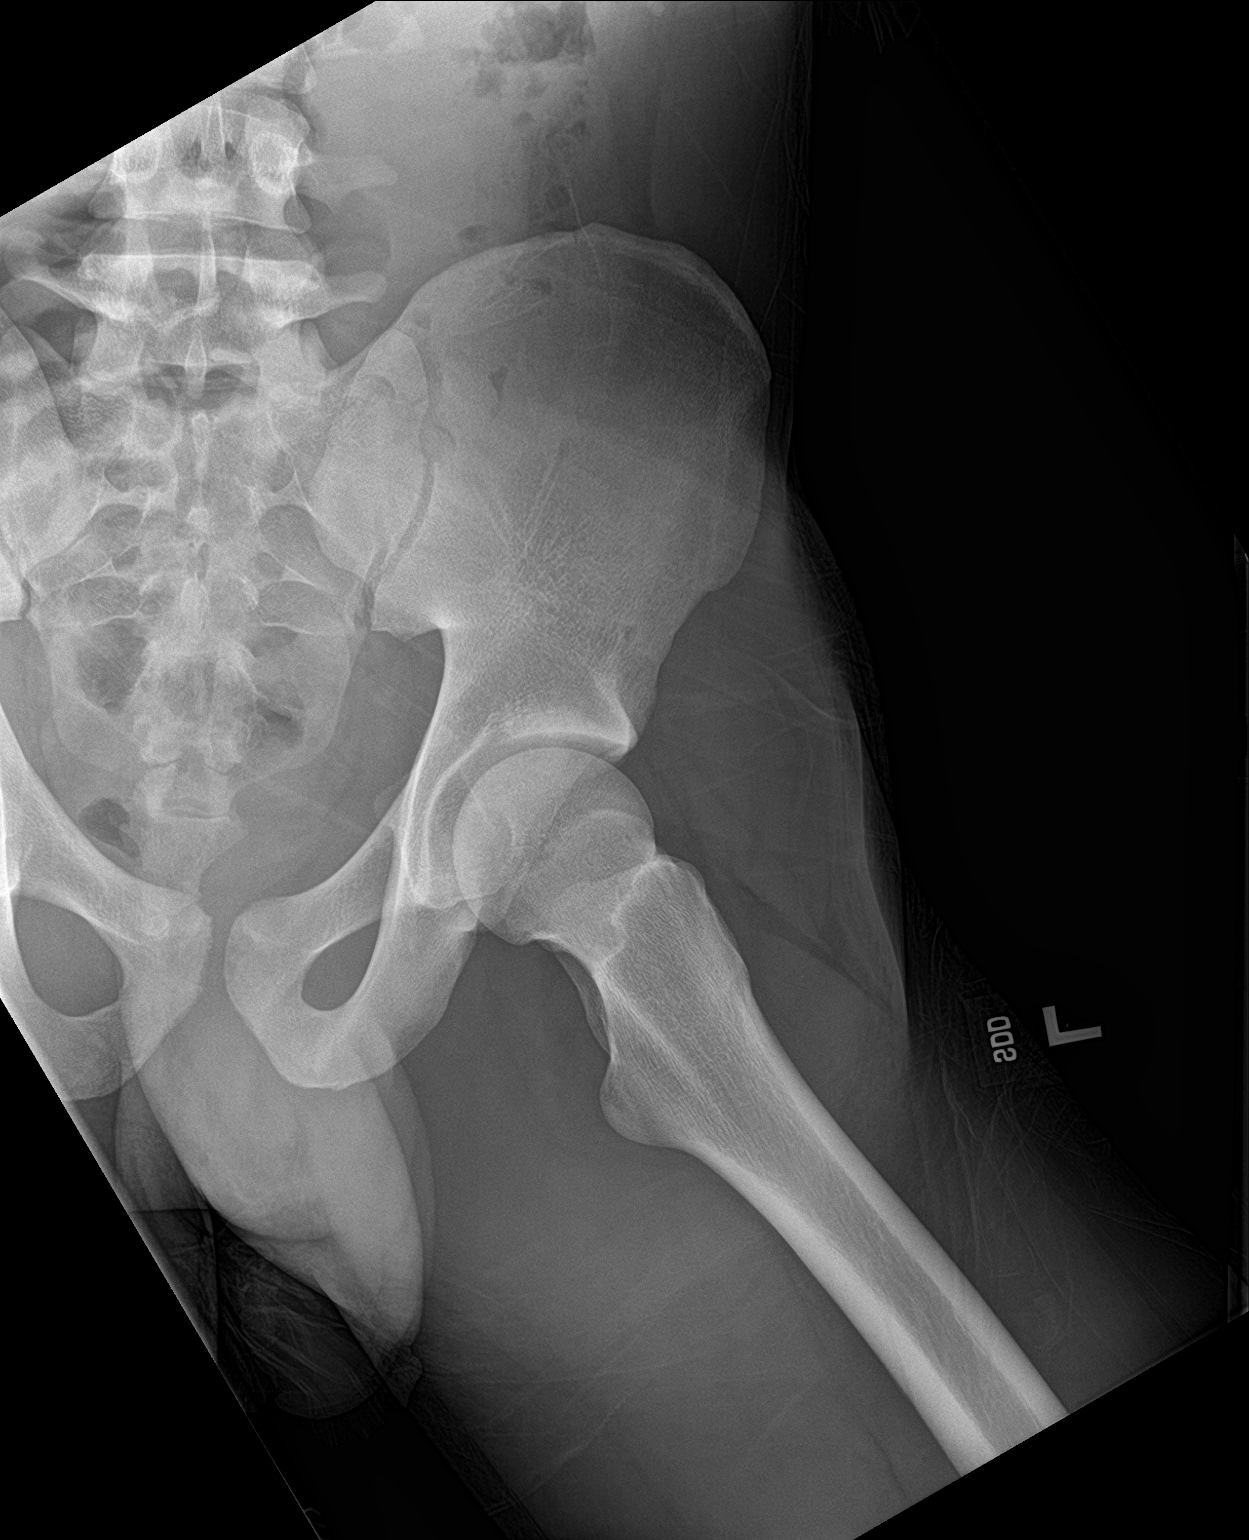

[3 of 3 positions shown; findings below may reference images not displayed]

FINDINGS: There is no evidence of hip fracture or dislocation. There is no
evidence of arthropathy or other focal bone abnormality.
IMPRESSION: Negative.

## 2021-11-21 IMAGING — CR DG FOOT COMPLETE 3+V*L*
3 series · 3 of 3 positions shown · non-contrast
Comparison: None.

CLINICAL DATA: Fall, pain, attention to calcaneus

EXAM:
LEFT ANKLE COMPLETE - 3+ VIEW; LEFT FOOT - COMPLETE 3+ VIEW

[foot ap]
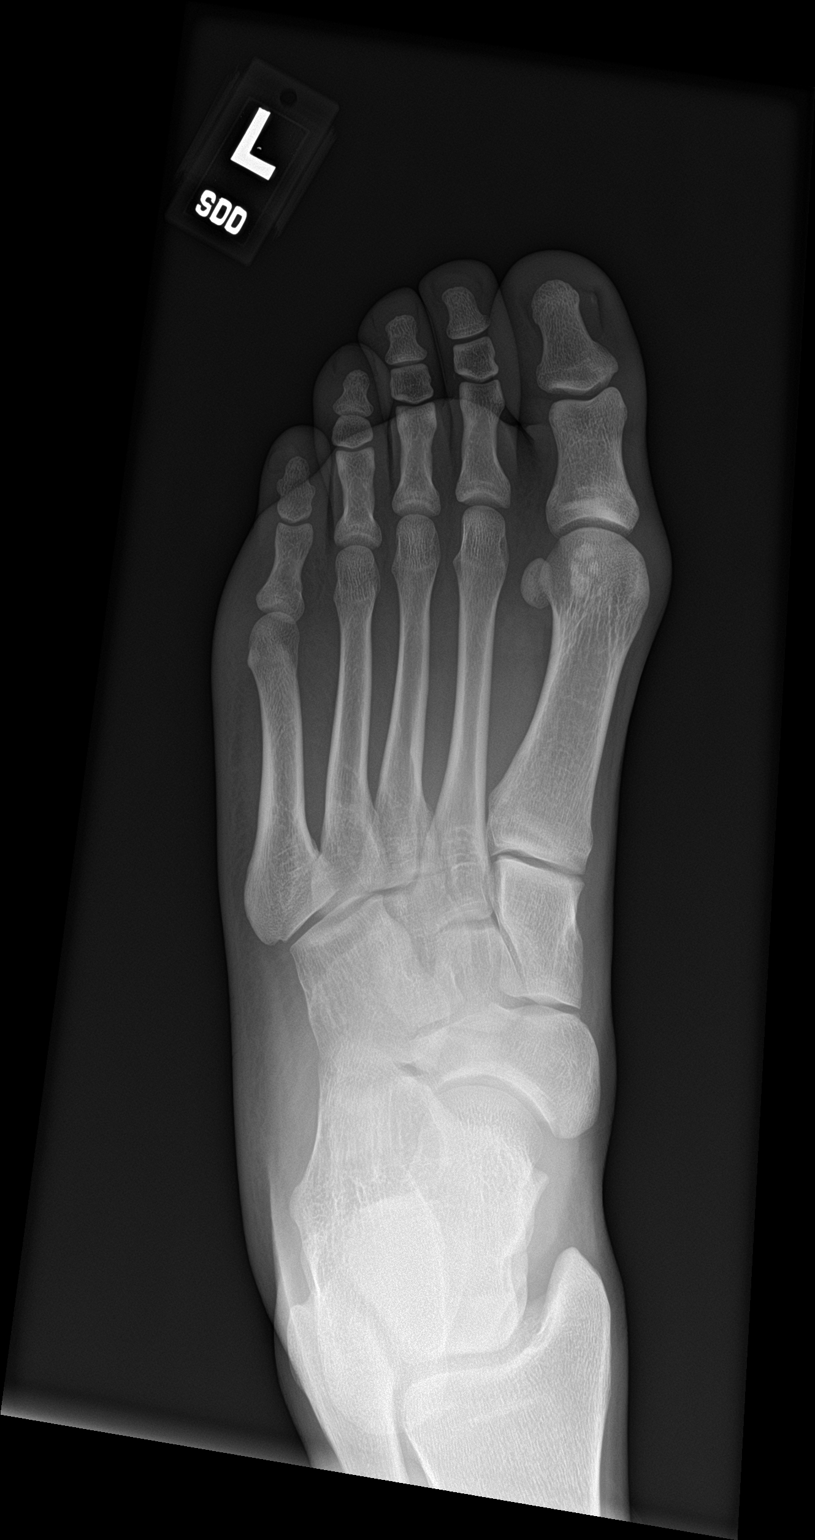

[foot obl]
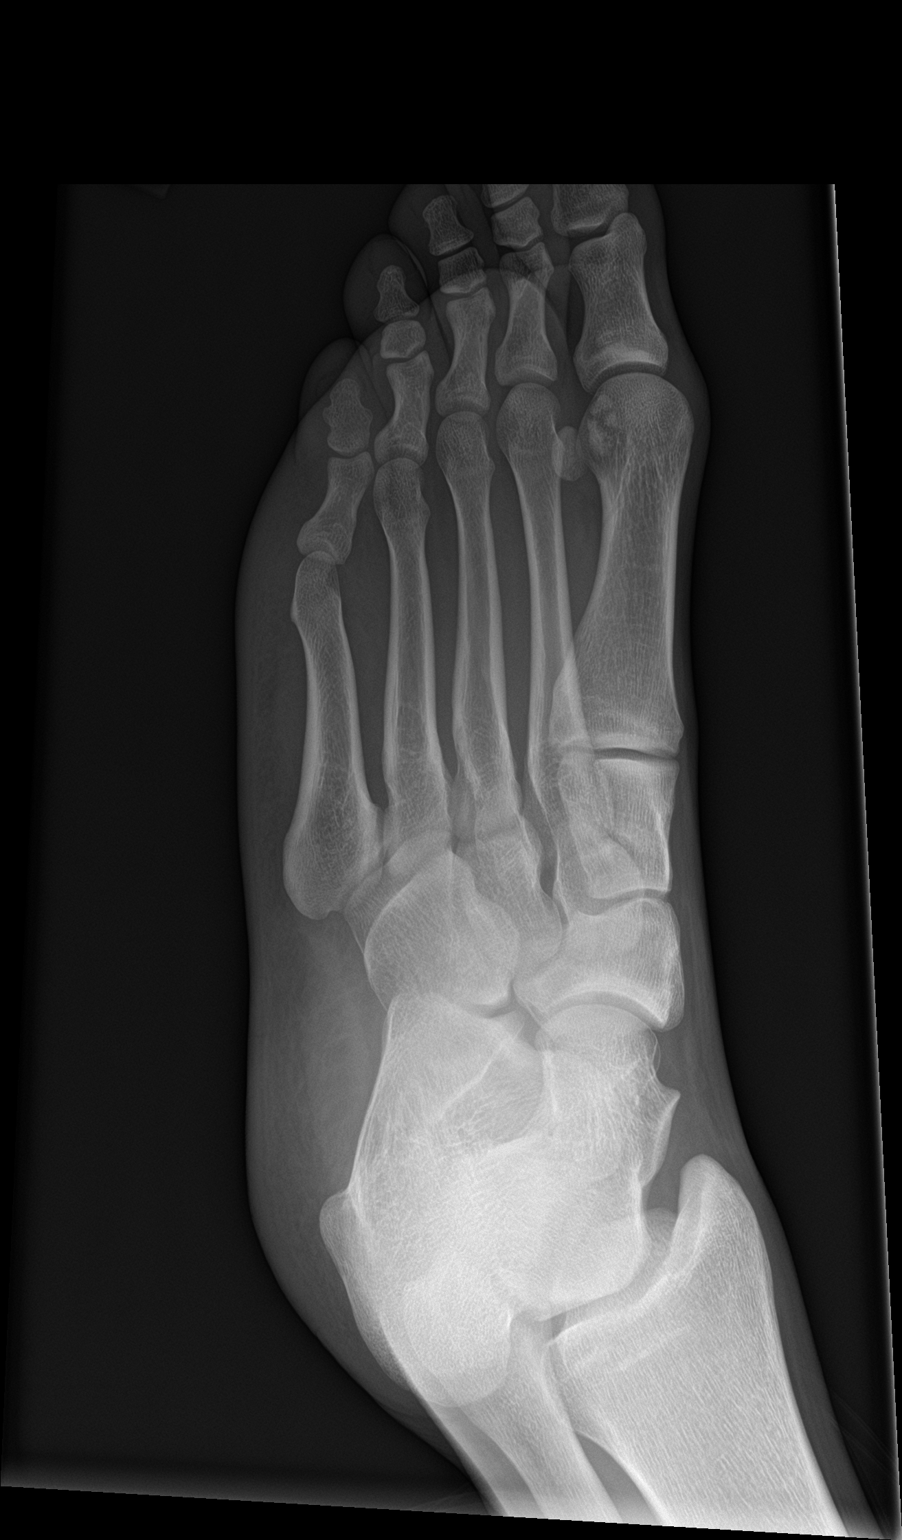

[foot lat]
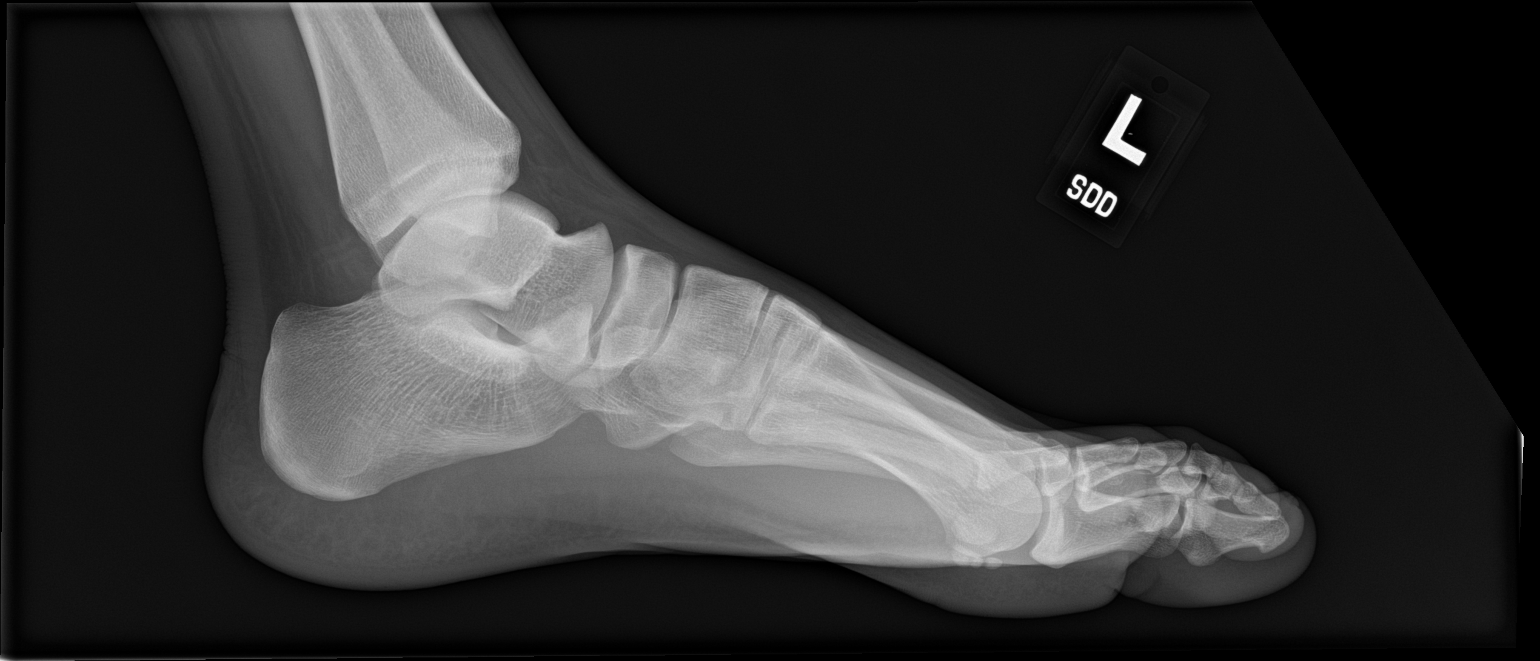

[3 of 3 positions shown; findings below may reference images not displayed]

FINDINGS: There is no evidence of fracture, dislocation, or joint effusion.
There is no evidence of arthropathy or other focal bone abnormality.
Soft tissues are unremarkable.
IMPRESSION: No fracture or dislocation of the left foot or ankle with specific
attention to the calcaneus.
